# Patient Record
Sex: Male | Born: 2000 | Race: Black or African American | Hispanic: No | Marital: Single
Health system: Southern US, Community
[De-identification: ages and names within clinical notes are randomized; demographics above are authoritative.]

## PROBLEM LIST (undated history)

## (undated) DIAGNOSIS — D3000 Benign neoplasm of unspecified kidney: Secondary | ICD-10-CM

## (undated) DIAGNOSIS — E215 Disorder of parathyroid gland, unspecified: Secondary | ICD-10-CM

## (undated) DIAGNOSIS — I10 Essential (primary) hypertension: Secondary | ICD-10-CM

## (undated) DIAGNOSIS — K660 Peritoneal adhesions (postprocedural) (postinfection): Secondary | ICD-10-CM

## (undated) HISTORY — PX: NEPHRECTOMY: SHX65

## (undated) HISTORY — DX: Peritoneal adhesions (postprocedural) (postinfection): K66.0

## (undated) HISTORY — DX: Disorder of parathyroid gland, unspecified: E21.5

---
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
## 2022-04-01 DIAGNOSIS — H5711 Ocular pain, right eye: Secondary | ICD-10-CM | POA: Diagnosis not present

## 2022-04-01 DIAGNOSIS — H00012 Hordeolum externum right lower eyelid: Secondary | ICD-10-CM | POA: Diagnosis not present
